# Patient Record
Sex: Male | Born: 2005 | Race: Black or African American | Hispanic: No | Marital: Single | Smoking: Never smoker
Health system: Southern US, Community
[De-identification: ages and names within clinical notes are randomized; demographics above are authoritative.]

## PROBLEM LIST (undated history)

## (undated) ENCOUNTER — Emergency Department (HOSPITAL_BASED_OUTPATIENT_CLINIC_OR_DEPARTMENT_OTHER): Admission: EM | Payer: Medicaid Other | Source: Home / Self Care

## (undated) DIAGNOSIS — T7840XA Allergy, unspecified, initial encounter: Secondary | ICD-10-CM

## (undated) HISTORY — PX: TONSILLECTOMY: SUR1361

## (undated) HISTORY — PX: TYMPANOSTOMY TUBE PLACEMENT: SHX32

## (undated) HISTORY — DX: Allergy, unspecified, initial encounter: T78.40XA

---
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
## 2023-05-20 IMAGING — MR MR FEMUR*L* WO/W CM
11 series · 40 of 40 positions shown · IV contrast (GADAVIST)
Comparison: X-ray 03/11/2021
COMPARISON: X-ray 03/11/2021

Addendum:
CLINICAL DATA: Sudden onset lateral left knee pain for 2 weeks. No
known injury. Palpable mass at the lateral aspect of the left knee.

EXAM:
MR OF THE LEFT LOWER EXTREMITY WITHOUT AND WITH CONTRAST
TECHNIQUE: Multiplanar, multisequence MR imaging of the left knee was performed
both before and after administration of intravenous contrast.
CONTRAST:  6mL GADAVIST GADOBUTROL 1 MMOL/ML IV SOLN

[Series 15: T1 · coronal · 4.0mm · 1.25mm/px · 4 of 26 slices shown (1 of 2)]
[im 1/26]
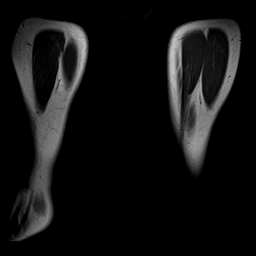
[im 9/26]
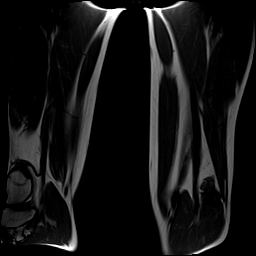
[im 17/26]
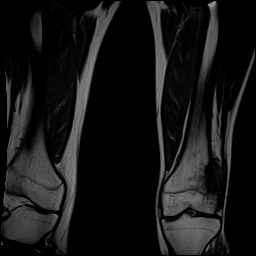
[im 26/26]
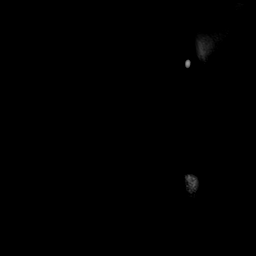

[Series 16: STIR · coronal · 4.0mm · 1.25mm/px · 4 of 26 slices shown (1 of 4)]
[im 1/26]
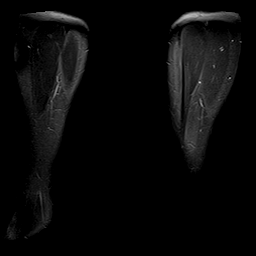
[im 9/26]
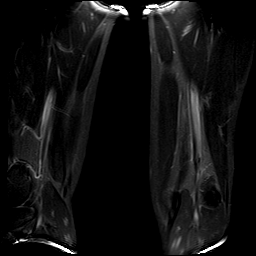
[im 17/26]
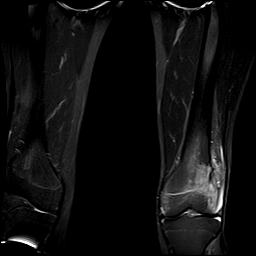
[im 26/26]
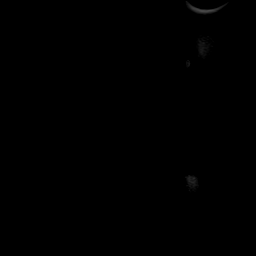

[Series 17: T1 · axial · 5.0mm · 0.86mm/px · z∈[-127,+62]mm · 4 of 30 slices shown (2 of 2)]
[im 1/30]
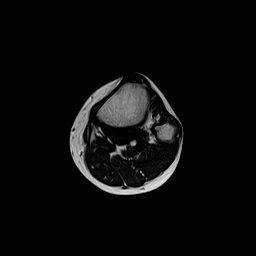
[im 10/30]
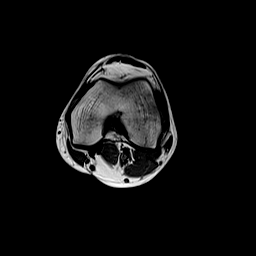
[im 20/30]
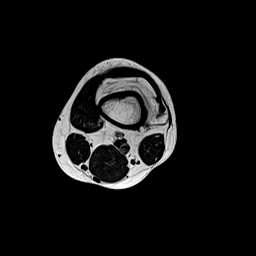
[im 30/30]
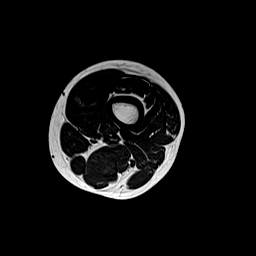

[Series 18: T2 fat-sat · axial · 5.0mm · 0.86mm/px · z∈[-127,+62]mm · 4 of 30 slices shown]
[im 1/30]
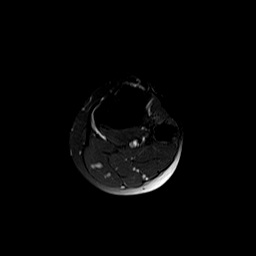
[im 10/30]
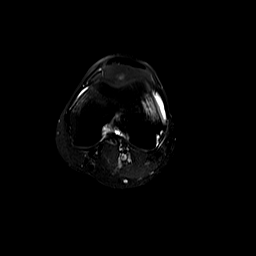
[im 20/30]
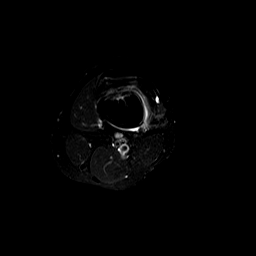
[im 30/30]
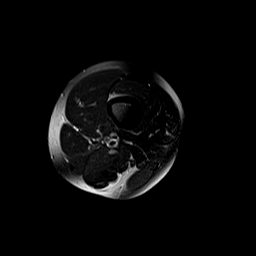

[Series 19: STIR · sagittal · 4.0mm · 1.25mm/px · 3 of 25 slices shown (2 of 4)]
[im 1/25]
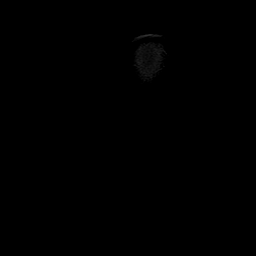
[im 13/25]
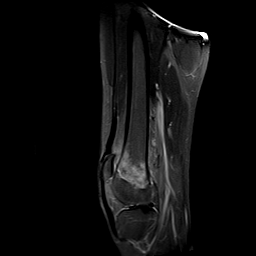
[im 25/25]
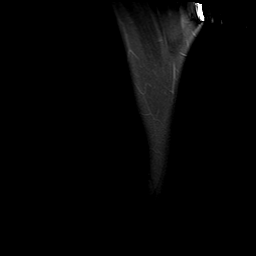

[Series 20: T1 fat-sat · axial · non-contrast · 5.0mm · 0.86mm/px · z∈[-127,+62]mm · 4 of 30 slices shown]
[im 1/30]
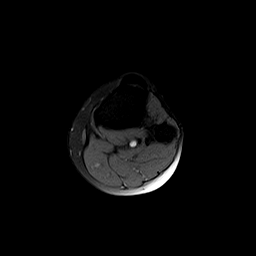
[im 10/30]
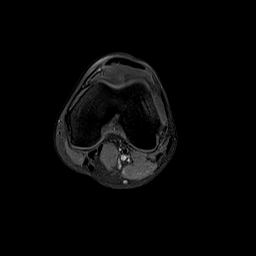
[im 20/30]
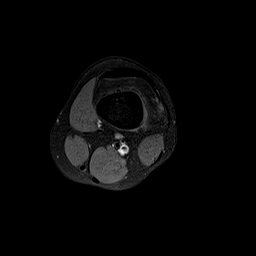
[im 30/30]
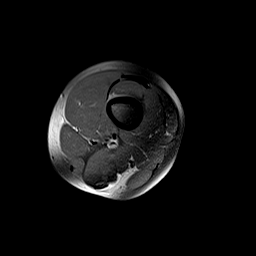

[Series 21: T1 fat-sat post-contrast · coronal · 4.0mm · 1.25mm/px · 3 of 26 slices shown (1 of 3)]
[im 1/26]
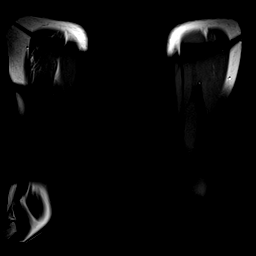
[im 13/26]
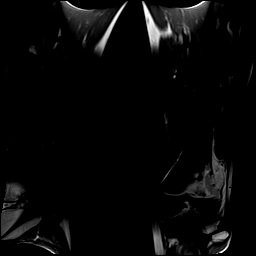
[im 26/26]
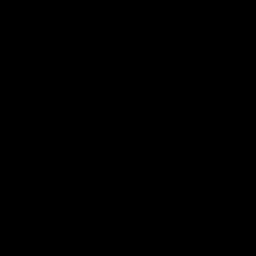

[Series 22: T1 fat-sat post-contrast · axial · 5.0mm · 0.86mm/px · z∈[-127,+62]mm · 4 of 30 slices shown (2 of 3)]
[im 1/30]
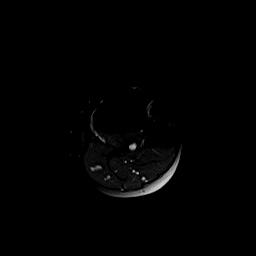
[im 10/30]
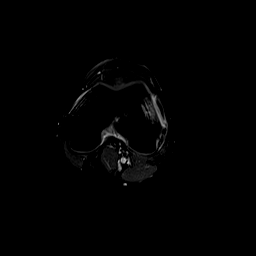
[im 20/30]
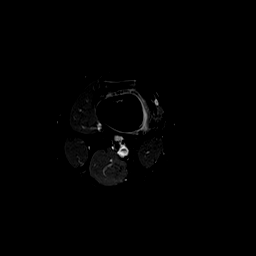
[im 30/30]
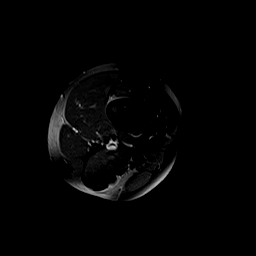

[Series 23: T1 fat-sat post-contrast · sagittal · 4.0mm · 1.25mm/px · 3 of 25 slices shown (3 of 3)]
[im 1/25]
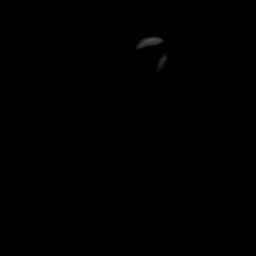
[im 13/25]
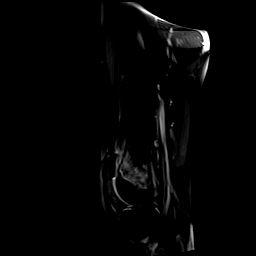
[im 25/25]
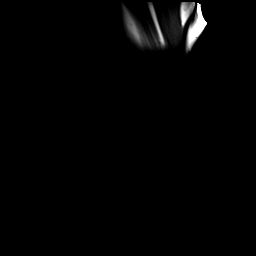

[Series 28: STIR · sagittal · 4.0mm · 1.41mm/px · 4 of 30 slices shown (3 of 4)]
[im 1/30]
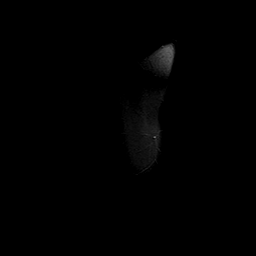
[im 10/30]
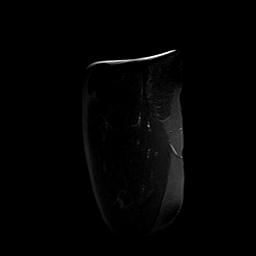
[im 20/30]
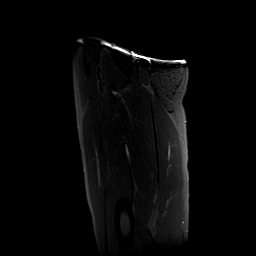
[im 30/30]
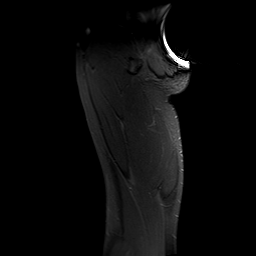

[Series 29: STIR · coronal · 4.0mm · 1.56mm/px · 3 of 26 slices shown (4 of 4)]
[im 1/26]
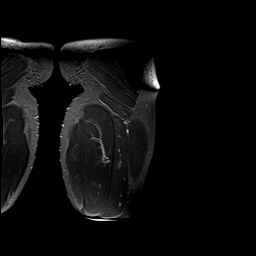
[im 13/26]
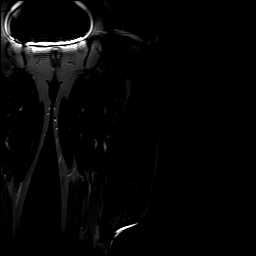
[im 26/26]
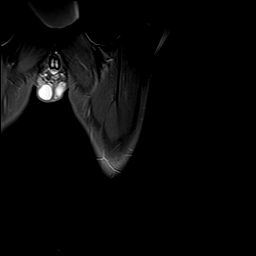

[40 of 40 positions shown; findings below may reference images not displayed]

FINDINGS: Bones/Joint/Cartilage

Destructive mass eccentrically located at the lateral aspect of the
distal femoral metaphysis measuring 4.6 x 2.3 x 3.1 cm (series 16,
image 14; series 18, image 16). Mass is isointense to skeletal
muscle on T1 and heterogeneously hyperintense on T2 weighted
imaging. Mass avidly enhances on postcontrast sequences. There is
edema and enhancement within the marrow of the adjacent distal
femoral metaphysis and lateral femoral condyle. Mass abuts the
peripheral margin of the distal femoral physis. There is periosteal
elevation with extraosseous soft tissue component of the mass. No
pathologic fracture. No additional masses are identified. No
internal derangement of the left knee.

Ligaments

Ligamentous structures of the left knee are intact.

Muscles and Tendons

Intramuscular edema within the distal aspect of the vastus lateralis
muscle which may be reactive although tumor infiltration is not
excluded. Otherwise unremarkable. Tendinous structures intact.

Soft tissues

No additional masses within the soft tissues.  No fluid collection.
IMPRESSION: 1. Aggressive bone neoplasm eccentrically located at the lateral
aspect of the distal femoral metaphysis measuring 4.6 x 2.3 x
cm. Appearance concerning for primary bone neoplasm such as
osteosarcoma or Ewing sarcoma. Orthopedic oncology referral is
recommended.
2. Intramuscular edema within the distal aspect of the vastus
lateralis muscle which may be reactive although tumor infiltration
is not excluded.
3. No pathologic fracture.

Ordering provider has been paged. Documentation of the communication
of the above findings will be added to the report in the form of an
addendum.

ADDENDUM:
These results were discussed by telephone on 03/21/2021 at [DATE]
with provider LENY EGGERS , who verbally acknowledged these
results.

*** End of Addendum ***
FINDINGS: Bones/Joint/Cartilage

Destructive mass eccentrically located at the lateral aspect of the
distal femoral metaphysis measuring 4.6 x 2.3 x 3.1 cm (series 16,
image 14; series 18, image 16). Mass is isointense to skeletal
muscle on T1 and heterogeneously hyperintense on T2 weighted
imaging. Mass avidly enhances on postcontrast sequences. There is
edema and enhancement within the marrow of the adjacent distal
femoral metaphysis and lateral femoral condyle. Mass abuts the
peripheral margin of the distal femoral physis. There is periosteal
elevation with extraosseous soft tissue component of the mass. No
pathologic fracture. No additional masses are identified. No
internal derangement of the left knee.

Ligaments

Ligamentous structures of the left knee are intact.

Muscles and Tendons

Intramuscular edema within the distal aspect of the vastus lateralis
muscle which may be reactive although tumor infiltration is not
excluded. Otherwise unremarkable. Tendinous structures intact.

Soft tissues

No additional masses within the soft tissues.  No fluid collection.
IMPRESSION: 1. Aggressive bone neoplasm eccentrically located at the lateral
aspect of the distal femoral metaphysis measuring 4.6 x 2.3 x
cm. Appearance concerning for primary bone neoplasm such as
osteosarcoma or Ewing sarcoma. Orthopedic oncology referral is
recommended.
2. Intramuscular edema within the distal aspect of the vastus
lateralis muscle which may be reactive although tumor infiltration
is not excluded.
3. No pathologic fracture.

Ordering provider has been paged. Documentation of the communication
of the above findings will be added to the report in the form of an
addendum.
